# Patient Record
Sex: Female | Born: 1945 | Race: White | Hispanic: No | Smoking: Former smoker
Health system: Southern US, Community
[De-identification: ages and names within clinical notes are randomized; demographics above are authoritative.]

## PROBLEM LIST (undated history)

## (undated) DIAGNOSIS — E079 Disorder of thyroid, unspecified: Secondary | ICD-10-CM

## (undated) DIAGNOSIS — J189 Pneumonia, unspecified organism: Secondary | ICD-10-CM

## (undated) DIAGNOSIS — E785 Hyperlipidemia, unspecified: Secondary | ICD-10-CM

## (undated) DIAGNOSIS — I00 Rheumatic fever without heart involvement: Secondary | ICD-10-CM

## (undated) DIAGNOSIS — M1990 Unspecified osteoarthritis, unspecified site: Secondary | ICD-10-CM

## (undated) HISTORY — DX: Disorder of thyroid, unspecified: E07.9

## (undated) HISTORY — PX: TONSILLECTOMY: SUR1361

## (undated) HISTORY — DX: Pneumonia, unspecified organism: J18.9

## (undated) HISTORY — PX: APPENDECTOMY: SHX54

## (undated) HISTORY — DX: Hyperlipidemia, unspecified: E78.5

## (undated) HISTORY — DX: Unspecified osteoarthritis, unspecified site: M19.90

## (undated) HISTORY — DX: Rheumatic fever without heart involvement: I00

---

## 2014-04-06 ENCOUNTER — Encounter: Payer: Self-pay | Admitting: Obstetrics & Gynecology

## 2014-04-06 ENCOUNTER — Ambulatory Visit (INDEPENDENT_AMBULATORY_CARE_PROVIDER_SITE_OTHER): Payer: Medicare Other | Admitting: Obstetrics & Gynecology

## 2014-04-06 VITALS — BP 139/89 | HR 80 | Resp 16 | Ht 65.0 in | Wt 143.0 lb

## 2014-04-06 DIAGNOSIS — Z0000 Encounter for general adult medical examination without abnormal findings: Secondary | ICD-10-CM

## 2014-04-06 DIAGNOSIS — IMO0002 Reserved for concepts with insufficient information to code with codable children: Secondary | ICD-10-CM

## 2014-04-06 DIAGNOSIS — N8111 Cystocele, midline: Secondary | ICD-10-CM

## 2014-04-06 NOTE — Progress Notes (Signed)
   Subjective:    Patient ID: Michelle Pollard, female    DOB: March 24, 1946, 68 y.o.   MRN: [id number]  HPI  68 yo [id number] (55 and 45 yo daughters) here today because she thinks that something has prolapased from her vagina. She doesn't have any big symptoms but with standing she does feel some vaginal pressure. She is not sexually active. Her children were vaginal delivery (largest was 9-14). She denies GSUI, UI.  In the distant past she did have some GSUI and was offered a pessary but her GSUI resolved with time.  Review of Systems Last pap 5/15 Last mammogram 5/15 Last colonoscopy- due this year    Objective:   Physical Exam  4th degree cystocele with Valsalva No uterine prolapse or rectocele Bimanual exam reveals no masses or tenderness Moderate vulvar atrophy  I placed a #3 ring with good results. She was able to remove and replace it.      Assessment & Plan:  Cystocele- We discussed watchful waiting, pessary, and surgery. She has opted for pessary at this point. RTC 1 month for pessary check. Preventative care- check Vitamin D level

## 2014-04-07 ENCOUNTER — Telehealth: Payer: Self-pay | Admitting: *Deleted

## 2014-04-07 LAB — VITAMIN D 25 HYDROXY (VIT D DEFICIENCY, FRACTURES): Vit D, 25-Hydroxy: 50 ng/mL (ref 30–89)

## 2014-04-07 NOTE — Telephone Encounter (Signed)
Pt notified of normal Vitamin D levels.

## 2014-05-06 ENCOUNTER — Ambulatory Visit (INDEPENDENT_AMBULATORY_CARE_PROVIDER_SITE_OTHER): Payer: Medicare Other | Admitting: Obstetrics & Gynecology

## 2014-05-06 ENCOUNTER — Encounter: Payer: Self-pay | Admitting: Obstetrics & Gynecology

## 2014-05-06 VITALS — BP 135/86 | HR 74 | Resp 16 | Ht 64.0 in | Wt 147.0 lb

## 2014-05-06 DIAGNOSIS — IMO0002 Reserved for concepts with insufficient information to code with codable children: Secondary | ICD-10-CM

## 2014-05-06 DIAGNOSIS — N8111 Cystocele, midline: Secondary | ICD-10-CM

## 2014-05-06 NOTE — Progress Notes (Signed)
   Subjective:    Patient ID: Michelle Pollard, female    DOB: 1946-01-31, 68 y.o.   MRN: [id number]  HPI  She has been using her pessary whenever she feels like it is needed for the last month. She has no problems or questions.  Review of Systems     Objective:   Physical Exam  I removed and cleaned her pessary. I inspected the vulva and vagina and found it all to be normal.      Assessment & Plan:  Prolapse- continue pessary prn RTC 1 year/prn sooner

## 2014-08-16 ENCOUNTER — Encounter: Payer: Self-pay | Admitting: Obstetrics & Gynecology

## 2015-05-02 ENCOUNTER — Ambulatory Visit (INDEPENDENT_AMBULATORY_CARE_PROVIDER_SITE_OTHER): Payer: Medicare Other | Admitting: Obstetrics & Gynecology

## 2015-05-02 ENCOUNTER — Encounter: Payer: Self-pay | Admitting: Obstetrics & Gynecology

## 2015-05-02 VITALS — BP 123/77 | HR 79 | Resp 16 | Ht 64.0 in | Wt 156.0 lb

## 2015-05-02 DIAGNOSIS — N8110 Cystocele, unspecified: Secondary | ICD-10-CM

## 2015-05-02 DIAGNOSIS — IMO0002 Reserved for concepts with insufficient information to code with codable children: Secondary | ICD-10-CM

## 2015-05-02 NOTE — Progress Notes (Signed)
   Subjective:    Patient ID: Michelle Pollard, female    DOB: [birth year], 69 y.o.   MRN: [id number]  HPI  This 69 yo SW P2 is here for an annual pessary check. She has no complaints. She removes it nightly. She had a mammogram 5/16 and reports that it was normal. She is abstinent.  Review of Systems She will have a colonoscopy in three weeks.    Objective:   Physical Exam  [id number] Breathing and ambulating and conversing normally Abd- bening Vaginal mucosa normal Bimanual normal 4th degree cystocele but no other prolapse     Assessment & Plan:  Cystocele- continue #3 ring pessary RTC 1 year/prn sooner

## 2017-08-21 ENCOUNTER — Other Ambulatory Visit (INDEPENDENT_AMBULATORY_CARE_PROVIDER_SITE_OTHER): Payer: Medicare Other

## 2017-08-21 DIAGNOSIS — R350 Frequency of micturition: Secondary | ICD-10-CM | POA: Diagnosis not present

## 2017-08-21 LAB — POCT URINALYSIS DIPSTICK
Bilirubin, UA: NEGATIVE
Blood, UA: NEGATIVE
Glucose, UA: NEGATIVE
Ketones, UA: NEGATIVE
Nitrite, UA: NEGATIVE
Spec Grav, UA: 1.025 (ref 1.010–1.025)
UROBILINOGEN UA: NEGATIVE U/dL — AB
pH, UA: 6 (ref 5.0–8.0)

## 2017-08-21 NOTE — Progress Notes (Signed)
Pt c/o frequent, painful urination. Pt states she has had a UTI before that was treated with Bactrim and she had an allergic reaction to it. Urine culture sent.

## 2017-08-22 LAB — URINE CULTURE
MICRO NUMBER:: 81252400
SPECIMEN QUALITY:: ADEQUATE

## 2017-09-04 ENCOUNTER — Ambulatory Visit (INDEPENDENT_AMBULATORY_CARE_PROVIDER_SITE_OTHER): Payer: Medicare Other | Admitting: Obstetrics & Gynecology

## 2017-09-04 ENCOUNTER — Encounter: Payer: Self-pay | Admitting: Obstetrics & Gynecology

## 2017-09-04 VITALS — BP 154/83 | HR 74 | Ht 65.0 in | Wt 163.0 lb

## 2017-09-04 DIAGNOSIS — R350 Frequency of micturition: Secondary | ICD-10-CM

## 2017-09-04 DIAGNOSIS — D219 Benign neoplasm of connective and other soft tissue, unspecified: Secondary | ICD-10-CM | POA: Diagnosis not present

## 2017-09-04 NOTE — Progress Notes (Signed)
Patient ID: Michelle Pollard, female   DOB: Aug 18, 1946, 71 y.o.   MRN: [id number]  No chief complaint on file.   HPI Michelle Pollard is a 71 y.o. female.  She is here for a pessary check as she is feeling a fair amount of discomfort with BMs with the pessary in. She had a X ray done which showed a high stool burden as well as a possible fibroid. She is concerned about the fibroid. She is also having urinary frequency but repeat urine tests rule out a UTI. HPI  Past Medical History:  Diagnosis Date  . Arthritis   . Hyperlipidemia   . Pneumonia   . Rheumatic fever   . Thyroid disease     Past Surgical History:  Procedure Laterality Date  . APPENDECTOMY    . TONSILLECTOMY      Family History  Problem Relation Age of Onset  . Hypertension Mother   . Angina Mother   . Cancer Mother        breast  . Heart attack Father   . Hyperlipidemia Brother     Social History Social History   Tobacco Use  . Smoking status: Former Smoker    Packs/day: 0.50    Years: 5.00    Pack years: 2.50    Types: Cigarettes  . Smokeless tobacco: Never Used  Substance Use Topics  . Alcohol use: Yes    Comment: wine occassionally  . Drug use: No    Allergies  Allergen Reactions  . Bactrim Ds [Sulfamethoxazole-Trimethoprim]   . Tetracyclines & Related Rash    Current Outpatient Medications  Medication Sig Dispense Refill  . ALPRAZolam (XANAX) 0.5 MG tablet     . Calcium Carbonate-Vit D-Min (CALCIUM 1200) 1200-1000 MG-UNIT CHEW Chew by mouth.    . lansoprazole (PREVACID) 15 MG capsule Take by mouth.    . levothyroxine (SYNTHROID, LEVOTHROID) 88 MCG tablet     . atorvastatin (LIPITOR) 10 MG tablet     . cholecalciferol (VITAMIN D) 1000 UNITS tablet Take 1,000 Units by mouth daily.    . meloxicam (MOBIC) 15 MG tablet Take 15 mg by mouth as needed for pain.     No current facility-administered medications for this visit.     Review of Systems Review of Systems  Blood pressure (!)  154/83, pulse 74, height 5\' 5"  (1.651 m), weight 163 lb (73.9 kg).  Physical Exam Physical Exam Breathing, conversing, and ambulating normally Well nourished, well hydrated White female, no apparent distress 4th degree cystocele, no rectocele Healthy vaginal mucosa, free of excoriations  I fitted her with a #2 ring with support (after removing her #3 ring). The #2 relieved her symptoms and did not cause pain.  Data Reviewed X ray from [facility]  Assessment   urinary frequency cystocele     Plan   urology referral She is not interested in surgical repair at this time I will have a #2 ring with support ordered and she can pick it up prn I ordered an u/s to evaluate the possible fibroid, but I reassured her that this is almost certainly a benign condition        Damek Ende C Maxx Pham 09/04/2017, 10:54 AM

## 2017-09-04 NOTE — Progress Notes (Signed)
Pt states that pessary can be uncomfortable when she has a bowel movement. Pt also states that she has frequent urination and burning quite often & when she is tested for a UTI, it comes back negative. Pt had abdominal xray done on 08/23/17 though [facility] and they told pt she may have a uterine fibroid (results are in care everywhere).   It wouldn't let me print the Xray Results so I copy and pasted what it said:  "Mild to moderate fecal retention throughout the colon. No bowel obstruction or free air. Coarse ossification cluster in the left pelvis, likely representing a uterine fibroid. No acute bone findings. Upper quadrant surgical clips."

## 2017-10-17 ENCOUNTER — Telehealth: Payer: Self-pay

## 2017-10-17 NOTE — Telephone Encounter (Signed)
Left message to make pt aware that her pessary is in and to call to make appointment.

## 2017-10-22 ENCOUNTER — Ambulatory Visit (INDEPENDENT_AMBULATORY_CARE_PROVIDER_SITE_OTHER): Payer: Medicare Other | Admitting: Obstetrics & Gynecology

## 2017-10-22 ENCOUNTER — Encounter: Payer: Self-pay | Admitting: Obstetrics & Gynecology

## 2017-10-22 VITALS — BP 135/84 | Resp 16 | Ht 65.0 in

## 2017-10-22 DIAGNOSIS — N8111 Cystocele, midline: Secondary | ICD-10-CM | POA: Diagnosis not present

## 2017-10-22 NOTE — Progress Notes (Signed)
   Subjective:    Patient ID: Michelle Pollard, female    DOB: [birth year], 72 y.o.   MRN: [id number]  HPI 72 year old single P2 here for her smaller pessary. She was having discomfort with her #3 ring when having a BM. The #2 pessary has now arrived.   Review of Systems     Objective:   Physical Exam Breathing, conversing, and ambulating normally Well nourished, well hydrated White female, no apparent distress The #2 ring with support was placed. She had no discomfort and it relieved her vaginal pressure.     Assessment & Plan:  Cystocele- #2 ring with support She will continue to remove and clean it at least weekly. Come back 1 year/prn sooner

## 2017-10-29 ENCOUNTER — Other Ambulatory Visit: Payer: Self-pay | Admitting: Obstetrics & Gynecology

## 2017-10-29 ENCOUNTER — Ambulatory Visit (INDEPENDENT_AMBULATORY_CARE_PROVIDER_SITE_OTHER): Payer: Medicare Other

## 2017-10-29 DIAGNOSIS — D259 Leiomyoma of uterus, unspecified: Secondary | ICD-10-CM

## 2017-10-29 DIAGNOSIS — D219 Benign neoplasm of connective and other soft tissue, unspecified: Secondary | ICD-10-CM

## 2017-11-06 ENCOUNTER — Telehealth: Payer: Self-pay | Admitting: *Deleted

## 2017-11-06 NOTE — Telephone Encounter (Signed)
Pt called requesting that her latest U/S report be faxed to Dr Gloriann Loan @ [facility].  This was faxed to [phone]

## 2018-01-01 ENCOUNTER — Encounter: Payer: Self-pay | Admitting: *Deleted

## 2018-10-29 ENCOUNTER — Ambulatory Visit: Payer: Medicare Other | Admitting: Physical Therapy

## 2018-10-29 ENCOUNTER — Encounter: Payer: Self-pay | Admitting: Physical Therapy

## 2018-10-29 ENCOUNTER — Other Ambulatory Visit: Payer: Self-pay

## 2018-10-29 DIAGNOSIS — R29898 Other symptoms and signs involving the musculoskeletal system: Secondary | ICD-10-CM | POA: Diagnosis not present

## 2018-10-29 DIAGNOSIS — M25571 Pain in right ankle and joints of right foot: Secondary | ICD-10-CM

## 2018-10-29 NOTE — Patient Instructions (Signed)
Access Code: [email or web address]  URL: [email or web address]  Date: 10/29/2018  Prepared by: Faustino Congress   Exercises  Gastroc Stretch on Wall - 3 reps - 1 sets - 30 sec hold - 2x daily - 7x weekly  Soleus Stretch on Wall - 3 reps - 1 sets - 30 sec hold - 2x daily - 7x weekly  Calf Mobilization with Small Ball - 3 reps - 1 sets - 30-60 sec hold - 1x daily - 7x weekly  Patient Education  Trigger Point Dry Needling

## 2018-10-30 NOTE — Therapy (Signed)
[place], [place], [address] Phone: [phone]   Fax:  [phone]  Physical Therapy Evaluation  Patient Details  Name: Michelle Pollard MRN: [id number] Date of Birth: January 23, 1946 Referring Provider (PT): Dr. Melrose Nakayama   Encounter Date: 10/29/2018  PT End of Session - 10/29/18 1514    Visit Number  1    Number of Visits  12    Date for PT Re-Evaluation  12/10/18    PT Start Time  5284    PT Stop Time  1513    PT Time Calculation (min)  42 min    Activity Tolerance  Patient tolerated treatment well    Behavior During Therapy  [facility] for tasks assessed/performed       Past Medical History:  Diagnosis Date  . Arthritis   . Hyperlipidemia   . Pneumonia   . Rheumatic fever   . Thyroid disease     Past Surgical History:  Procedure Laterality Date  . APPENDECTOMY    . TONSILLECTOMY      There were no vitals filed for this visit.   Subjective Assessment - 10/29/18 1434    Subjective  Pt is a 73 y/o female who presents to OPPT for Rt ankle pain.  Pt reports intermittent pain x 2 months with no known injury.  Pt also resports Rt knee pain which is mostly resolved.      Limitations  Walking    How long can you walk comfortably?  occasional pain; worse following    Patient Stated Goals  improve ankle pain    Currently in Pain?  Yes    Pain Score  1    up to 3/10; at best 0/10   Pain Location  Heel    Pain Orientation  Right    Pain Descriptors / Indicators  Tightness;Burning    Pain Type  Acute pain    Pain Onset  More than a month ago    Pain Frequency  Intermittent    Aggravating Factors   worse in AM (especially when prior day very active); hamstring stretches provided by MD office    Pain Relieving Factors  improved after walking in AM 1-2 min         [facility] PT Assessment - 10/29/18 1438      Assessment   Medical Diagnosis  Rt ankle sprain/pain    Referring Provider (PT)   Dr. Melrose Nakayama    Onset Date/Surgical Date  --   2 months   Next MD Visit  11/26/18    Prior Therapy  n/a      Precautions   Precautions  None      Restrictions   Weight Bearing Restrictions  No      Balance Screen   Has the patient fallen in the past 6 months  No    Has the patient had a decrease in activity level because of a fear of falling?   No    Is the patient reluctant to leave their home because of a fear of falling?   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    Leisure  movies, previously agility sports with dogs; regular walking several times/wk      Cognition   Overall Cognitive Status  Within Functional Limits for tasks assessed      Observation/Other Assessments   Focus on Therapeutic Outcomes (FOTO)   66 (34% limited; predicted 24% limited)      ROM / Strength   AROM / PROM / Strength  AROM;Strength      AROM   AROM Assessment Site  Ankle    Right/Left Ankle  Right;Left    Right Ankle Dorsiflexion  3    Right Ankle Plantar Flexion  58    Right Ankle Inversion  30    Right Ankle Eversion  16    Left Ankle Dorsiflexion  -3    Left Ankle Plantar Flexion  63    Left Ankle Inversion  34    Left Ankle Eversion  12      Strength   Strength Assessment Site  Hip;Knee;Ankle    Right/Left Hip  Right;Left    Right Hip Flexion  5/5    Left Hip Flexion  5/5    Right/Left Knee  Right;Left    Right Knee Extension  5/5    Left Knee Extension  5/5    Right/Left Ankle  Right;Left    Right Ankle Dorsiflexion  5/5    Right  Ankle Plantar Flexion  5/5    Right Ankle Inversion  5/5    Right Ankle Eversion  5/5    Left Ankle Dorsiflexion  5/5    Left Ankle Plantar Flexion  5/5    Left Ankle Inversion  5/5    Left Ankle Eversion  5/5      Flexibility   Soft Tissue Assessment /Muscle Length  yes    Hamstrings  mild tightness on Rt      Palpation   Palpation comment  tenderness Rt peroneal tendon and trigger points in gastroc and soleus      Ambulation/Gait   Gait Comments  no significant gait abnormalities noted                Objective measurements completed on examination: See above findings.      [facility] Adult PT Treatment/Exercise - 10/29/18 1438      Self-Care   Self-Care  Other Self-Care Comments    Other Self-Care Comments   self STM with ball      Exercises   Exercises  Ankle      Ankle Exercises: Stretches   Soleus Stretch  1 rep;30 seconds    Gastroc Stretch  1 rep;30 seconds             PT Education - 10/29/18 1513    Education Details  HEP    Person(s) Educated  Patient    Methods  Explanation;Demonstration;Handout    Comprehension  Verbalized understanding;Returned demonstration;Need further instruction          PT Long Term Goals - 10/30/18 0803      PT LONG TERM GOAL #1   Title  independent with HEP    Status  New    Target Date  12/11/18      PT LONG TERM GOAL #2   Title  demonstrate Rt  ankle ROM within 1-2 degrees of Lt ankle for improved function    Status  New    Target Date  12/11/18      PT LONG TERM GOAL #3   Title  report pain < 2/10 in mornings for improved pain and function    Status  New    Target Date  12/11/18      PT LONG TERM GOAL #4   Title  FOTO score improved to </= 25% limited for improved function    Status  New    Target Date  12/11/18             Plan - 10/30/18 0800    Clinical Impression Statement  Pt is a 73 y/o female who presents to OPPT for Rt ankle pain x 2 months.  Pt demonstrates mild ROM limitations,  and active trigger points affecting functional mobility.  Clinical findings consistent with achilles tendinosis and/or plantar fasciitis.  Pt will benefit from PT to address deficits listed.      Clinical Presentation  Stable    Clinical Decision Making  Low    Rehab Potential  Good    PT Frequency  2x / week    PT Duration  6 weeks    PT Treatment/Interventions  ADLs/Self Care Home Management;Cryotherapy;Ultrasound;Moist Heat;Iontophoresis 4mg /ml Dexamethasone;Electrical Stimulation;Gait training;Stair training;Functional mobility training;Balance training;Therapeutic exercise;Therapeutic activities;Patient/family education;Manual techniques;Dry needling;Passive range of motion;Taping;Vasopneumatic Device    PT Next Visit Plan  review HEP, PRN, manual/modalities/DN, gentle strengthening exercises    PT Home Exercise Plan  Access Code: [email or web address]        Patient will benefit from skilled therapeutic intervention in order to improve the following deficits and impairments:  Decreased range of motion, Increased muscle spasms, Increased fascial restricitons, Pain  Visit Diagnosis: Pain in right ankle and joints of right foot - Plan: PT plan of care cert/re-cert  Other symptoms and signs involving the musculoskeletal system - Plan: PT plan of care cert/re-cert     Problem List There are no active problems to display for this patient.     Laureen Abrahams, PT, DPT 10/30/18 8:09 AM     [facility] [address] [place] [address], [place], [address] Phone: [phone]   Fax:  [phone]  Name: Shani Fitch MRN: [id number] Date of Birth: [birth year]

## 2018-11-04 ENCOUNTER — Ambulatory Visit: Payer: Medicare Other | Admitting: Physical Therapy

## 2018-11-04 ENCOUNTER — Encounter: Payer: Self-pay | Admitting: Physical Therapy

## 2018-11-04 DIAGNOSIS — R29898 Other symptoms and signs involving the musculoskeletal system: Secondary | ICD-10-CM

## 2018-11-04 DIAGNOSIS — M25571 Pain in right ankle and joints of right foot: Secondary | ICD-10-CM | POA: Diagnosis not present

## 2018-11-04 NOTE — Therapy (Signed)
[place], [place], [address] Phone: [phone]   Fax:  [phone]  Physical Therapy Treatment  Patient Details  Name: Michelle Pollard MRN: [id number] Date of Birth: 02-04-1946 Referring Provider (PT): Dr. Melrose Nakayama   Encounter Date: 11/04/2018  PT End of Session - 11/04/18 1252    Visit Number  2    Number of Visits  12    Date for PT Re-Evaluation  12/10/18    PT Start Time  1200    PT Stop Time  1240    PT Time Calculation (min)  40 min    Activity Tolerance  Patient tolerated treatment well    Behavior During Therapy  [facility] for tasks assessed/performed       Past Medical History:  Diagnosis Date  . Arthritis   . Hyperlipidemia   . Pneumonia   . Rheumatic fever   . Thyroid disease     Past Surgical History:  Procedure Laterality Date  . APPENDECTOMY    . TONSILLECTOMY      There were no vitals filed for this visit.  Subjective Assessment - 11/04/18 1200    Subjective  ankle has felt a little better; walking every day for at least 30 min.  doing HEP provided here and stopped other exercises.     Patient Stated Goals  improve ankle pain    Currently in Pain?  Yes    Pain Score  0-No pain   up to a 1/10 this morning   Pain Location  Heel    Pain Orientation  Right;Posterior    Pain Descriptors / Indicators  Tightness    Pain Type  Acute pain    Pain Onset  More than a month ago    Pain Frequency  Intermittent    Aggravating Factors   worse in AM    Pain Relieving Factors  improved after walking, stretches are helpful         [facility] PT Assessment - 11/04/18 1205      Assessment   Medical Diagnosis  Rt ankle sprain/pain    Referring Provider (PT)  Dr. Melrose Nakayama                   [facility] Adult PT Treatment/Exercise - 11/04/18 1205      Self-Care   Other Self-Care Comments   reviewed use of tennis ball and rolling instrument for STM and to decrease  fascial adhesions      Manual Therapy   Manual Therapy  Soft tissue mobilization    Manual therapy comments  pt prone    Soft tissue mobilization  IASTM to Rt gastroc/soleus and plantar fascia      Ankle Exercises: Aerobic   Recumbent Bike  L2 x 5 min      Ankle Exercises: Stretches   Soleus Stretch  2 reps;30 seconds    Gastroc Stretch  2 reps;30 seconds      Ankle Exercises: Standing   Other Standing Ankle Exercises  4-way theraband x 20 reps; Rt; green                  PT Long Term Goals - 10/30/18 0803      PT LONG TERM GOAL #1   Title  independent with HEP    Status  New    Target Date  12/11/18      PT LONG TERM GOAL #2   Title  demonstrate Rt ankle ROM within  1-2 degrees of Lt ankle for improved function    Status  New    Target Date  12/11/18      PT LONG TERM GOAL #3   Title  report pain < 2/10 in mornings for improved pain and function    Status  New    Target Date  12/11/18      PT LONG TERM GOAL #4   Title  FOTO score improved to </= 25% limited for improved function    Status  New    Target Date  12/11/18            Plan - 11/04/18 1252    Clinical Impression Statement  Pt tolerated session well today reporting decreased tightness following manual therapy.  Overall HEP has been beneficial and burning pain seems to be resolved at this time.  Initiated gentle strengthening but slight increase in symptoms following, so plan to provide for HEP if symptoms don't flare up.  Will continue to benefit from PT to maximize function.    Rehab Potential  Good    PT Frequency  2x / week    PT Duration  6 weeks    PT Treatment/Interventions  ADLs/Self Care Home Management;Cryotherapy;Ultrasound;Moist Heat;Iontophoresis 4mg /ml Dexamethasone;Electrical Stimulation;Gait training;Stair training;Functional mobility training;Balance training;Therapeutic exercise;Therapeutic activities;Patient/family education;Manual techniques;Dry needling;Passive range of  motion;Taping;Vasopneumatic Device    PT Next Visit Plan  continue per POC, pt not sure about DN at this time, manual/modalities/DN, gentle strengthening exercises and add to HEP as she tolerates    PT Home Exercise Plan  Access Code: [email or web address]     Consulted and Agree with Plan of Care  Patient       Patient will benefit from skilled therapeutic intervention in order to improve the following deficits and impairments:  Decreased range of motion, Increased muscle spasms, Increased fascial restricitons, Pain  Visit Diagnosis: Pain in right ankle and joints of right foot  Other symptoms and signs involving the musculoskeletal system     Problem List There are no active problems to display for this patient.     Laureen Abrahams, PT, DPT 11/04/18 12:55 PM    [facility] [place], [place], [address] Phone: [phone]   Fax:  [phone]  Name: Michelle Pollard MRN: [id number] Date of Birth: [birth year]

## 2018-11-06 ENCOUNTER — Encounter: Payer: Self-pay | Admitting: Physical Therapy

## 2018-11-06 ENCOUNTER — Ambulatory Visit: Payer: Medicare Other | Admitting: Physical Therapy

## 2018-11-06 DIAGNOSIS — M25571 Pain in right ankle and joints of right foot: Secondary | ICD-10-CM | POA: Diagnosis not present

## 2018-11-06 DIAGNOSIS — R29898 Other symptoms and signs involving the musculoskeletal system: Secondary | ICD-10-CM | POA: Diagnosis not present

## 2018-11-06 NOTE — Therapy (Signed)
[place], [place], [address] Phone: [phone]   Fax:  [phone]  Physical Therapy Treatment  Patient Details  Name: Michelle Pollard MRN: [id number] Date of Birth: 03-01-1946 Referring Provider (PT): Dr. Melrose Nakayama   Encounter Date: 11/06/2018  PT End of Session - 11/06/18 1105    Visit Number  3    Number of Visits  12    Date for PT Re-Evaluation  12/10/18    PT Start Time  1105    PT Stop Time  1155   MHP last 10 min    PT Time Calculation (min)  50 min    Activity Tolerance  Patient tolerated treatment well    Behavior During Therapy  [facility] for tasks assessed/performed       Past Medical History:  Diagnosis Date  . Arthritis   . Hyperlipidemia   . Pneumonia   . Rheumatic fever   . Thyroid disease     Past Surgical History:  Procedure Laterality Date  . APPENDECTOMY    . TONSILLECTOMY      There were no vitals filed for this visit.  Subjective Assessment - 11/06/18 1109    Subjective  Pt reports she continues to have twinges of pain that last up to 5 min at a time.   She went walking for 30 min after last visit; Achilles did well, but the lateral part of ankle had some increased pain (1/10) that lasted 30 additional minutes.     How long can you walk comfortably?  occasional pain; worse following    Patient Stated Goals  improve ankle pain    Currently in Pain?  No/denies    Pain Score  0-No pain         [facility] PT Assessment - 11/06/18 0001      Assessment   Medical Diagnosis  Rt ankle sprain/pain    Referring Provider (PT)  Dr. Melrose Nakayama    Onset Date/Surgical Date  --   2 months   Next MD Visit  11/26/18    Prior Therapy  n/a      AROM   Right/Left Ankle  Right    Right Ankle Dorsiflexion  8       [facility] Adult PT Treatment/Exercise - 11/06/18 0001      Modalities   Modalities  Moist Heat      Moist Heat Therapy   Number Minutes Moist Heat  10  Minutes    Moist Heat Location  Ankle   Rt     Manual Therapy   Soft tissue mobilization  STM to Rt gastroc/soleus, fibularis muscles.  IASTM to fibularis group not tolerated.      Ankle Exercises: Aerobic   Recumbent Bike  L2 x 4 min for warm up.       Ankle Exercises: Stretches   Soleus Stretch  2 reps;30 seconds    Gastroc Stretch  2 reps;30 seconds   Lt/Rt      Ankle Exercises: Standing   SLS  SLS Rt/Lt 30 sec x 2 reps each side with occasional UE support to steady    Warrior II  Rt/Lt foot leading x 20 sec x 2 reps each side.     Other Standing Ankle Exercises  Lt step down and Rt retro step up for ROM x 10 reps (UE support on rails)      Ankle Exercises: Supine   T-Band  red band x 15 reps each (inversion,  eversion, DF)                  PT Long Term Goals - 10/30/18 0803      PT LONG TERM GOAL #1   Title  independent with HEP    Status  New    Target Date  12/11/18      PT LONG TERM GOAL #2   Title  demonstrate Rt ankle ROM within 1-2 degrees of Lt ankle for improved function    Status  New    Target Date  12/11/18      PT LONG TERM GOAL #3   Title  report pain < 2/10 in mornings for improved pain and function    Status  New    Target Date  12/11/18      PT LONG TERM GOAL #4   Title  FOTO score improved to </= 25% limited for improved function    Status  New    Target Date  12/11/18            Plan - 11/06/18 1145    Clinical Impression Statement  Improved Rt ankle DF ROM.  She tolerated all exercises well; reduced band exercise to red with improved ability.  Palpable tightness/point tenderness in Rt fibularis brevis.  Tightness in fibularis group and calf reduced with STM and heat.  Pt progressing towards goals.     Rehab Potential  Good    PT Frequency  2x / week    PT Duration  6 weeks    PT Treatment/Interventions  ADLs/Self Care Home Management;Cryotherapy;Ultrasound;Moist Heat;Iontophoresis 4mg /ml Dexamethasone;Electrical  Stimulation;Gait training;Stair training;Functional mobility training;Balance training;Therapeutic exercise;Therapeutic activities;Patient/family education;Manual techniques;Dry needling;Passive range of motion;Taping;Vasopneumatic Device    PT Next Visit Plan  continue per POC, trial of ionto, continue manual/modalities/DN, gentle strengthening exercises and add to HEP as she tolerates    PT Home Exercise Plan  Access Code: [email or web address] - verbally added SLS and ankle circles/alphabet.     Consulted and Agree with Plan of Care  Patient       Patient will benefit from skilled therapeutic intervention in order to improve the following deficits and impairments:  Decreased range of motion, Increased muscle spasms, Increased fascial restricitons, Pain  Visit Diagnosis: Pain in right ankle and joints of right foot  Other symptoms and signs involving the musculoskeletal system     Problem List There are no active problems to display for this patient.  Kerin Perna, PTA 11/06/18 1:23 PM  [facility] [place], [place], [address] Phone: [phone]   Fax:  [phone]  Name: Michelle Pollard MRN: [id number] Date of Birth: [birth year]

## 2018-11-12 ENCOUNTER — Ambulatory Visit: Payer: Medicare Other | Admitting: Physical Therapy

## 2018-11-12 DIAGNOSIS — M25571 Pain in right ankle and joints of right foot: Secondary | ICD-10-CM | POA: Diagnosis not present

## 2018-11-12 DIAGNOSIS — R29898 Other symptoms and signs involving the musculoskeletal system: Secondary | ICD-10-CM | POA: Diagnosis not present

## 2018-11-12 NOTE — Patient Instructions (Signed)

## 2018-11-12 NOTE — Therapy (Signed)
[place], [place], [address] Phone: [phone]   Fax:  [phone]  Physical Therapy Treatment  Patient Details  Name: Michelle Pollard MRN: [id number] Date of Birth: 09/09/1946 Referring Provider (PT): Dr. Melrose Nakayama   Encounter Date: 11/12/2018  PT End of Session - 11/12/18 1018    Visit Number  4    Number of Visits  12    Date for PT Re-Evaluation  12/10/18    PT Start Time  0936    PT Stop Time  1016    PT Time Calculation (min)  40 min       Past Medical History:  Diagnosis Date  . Arthritis   . Hyperlipidemia   . Pneumonia   . Rheumatic fever   . Thyroid disease     Past Surgical History:  Procedure Laterality Date  . APPENDECTOMY    . TONSILLECTOMY      There were no vitals filed for this visit.  Subjective Assessment - 11/12/18 0943    Subjective  pt reports her Achilles has been more sore since performing the soleus stretch. "My calf feels much better now." She has not been able to walk as far either.     How long can you walk comfortably?  occasional pain; worse following    Patient Stated Goals  improve ankle pain    Currently in Pain?  Yes    Pain Score  1    up to 4/10    Pain Location  Ankle    Pain Orientation  Right;Posterior    Aggravating Factors   worse in morning; soleus stretch    Pain Relieving Factors  improved after walking a little.          [facility] PT Assessment - 11/12/18 0001      Assessment   Medical Diagnosis  Rt ankle sprain/pain    Referring Provider (PT)  Dr. Melrose Nakayama    Onset Date/Surgical Date  --   2 months   Next MD Visit  11/26/18    Prior Therapy  n/a      AROM   Right/Left Ankle  Right;Left    Right Ankle Dorsiflexion  11    Right Ankle Plantar Flexion  58    Right Ankle Inversion  41    Right Ankle Eversion  18    Left Ankle Dorsiflexion  7    Left Ankle Plantar Flexion  58    Left Ankle Inversion  38    Left  Ankle Eversion  12      Strength   Right Ankle Dorsiflexion  5/5    Right Ankle Inversion  5/5    Right Ankle Eversion  5/5      Palpation   Palpation comment  tenderness with palpation to medial Rt Achilles tendon, as well as distal post tib tendon (along tibia and into foot)       [facility] Adult PT Treatment/Exercise - 11/12/18 0001      Self-Care   Self-Care  Heat/Ice Application    Heat/Ice Application  Pt instructed in use of ice application to bottom of foot with ice bottle; pt returned demo with cues.       Modalities   Modalities  Iontophoresis;Ultrasound      Ultrasound   Ultrasound Location  Rt medial ankle at Achilles / post tib tendon    Ultrasound Parameters  50%, 1.1 w/cm2, 8 min     Ultrasound Goals  Pain      Iontophoresis   Type of Iontophoresis  Dexamethasone    Location  Lt medial ankle at Achilles tendon/ post tib tendon    Dose  1.0 cc    Time  4 hr patch, 80 mA stat patch      Manual Therapy   Soft tissue mobilization  STM to Rt gastroc/soleus muscles.        Ankle Exercises: Aerobic   Nustep  L4: 6 min for warm up      Ankle Exercises: Stretches   Soleus Stretch  2 reps;30 seconds   seated, with strap, each leg   Gastroc Stretch  2 reps;30 seconds   incline board     Ankle Exercises: Supine   Other Supine Ankle Exercises  Rt ankle circles                   PT Long Term Goals - 10/30/18 0803      PT LONG TERM GOAL #1   Title  independent with HEP    Status  New    Target Date  12/11/18      PT LONG TERM GOAL #2   Title  demonstrate Rt ankle ROM within 1-2 degrees of Lt ankle for improved function    Status  New    Target Date  12/11/18      PT LONG TERM GOAL #3   Title  report pain < 2/10 in mornings for improved pain and function    Status  New    Target Date  12/11/18      PT LONG TERM GOAL #4   Title  FOTO score improved to </= 25% limited for improved function    Status  New    Target Date  12/11/18             Plan - 11/12/18 1310    Clinical Impression Statement  Pt has reported increase pain in Rt Achilles with standing soleus stretch; improved tolerance with seated stretch.  Trial of [place] and ionto to area.  No new goals met this date.      Rehab Potential  Good    PT Frequency  2x / week    PT Duration  6 weeks    PT Treatment/Interventions  ADLs/Self Care Home Management;Cryotherapy;Ultrasound;Moist Heat;Iontophoresis 67m/ml Dexamethasone;Electrical Stimulation;Gait training;Stair training;Functional mobility training;Balance training;Therapeutic exercise;Therapeutic activities;Patient/family education;Manual techniques;Dry needling;Passive range of motion;Taping;Vasopneumatic Device    PT Next Visit Plan  continue per POC, pt not sure about DN at this time, manual/modalities/DN, gentle strengthening exercises and add to HEP as she tolerates    PT Home Exercise Plan  Access Code: [email or web address]     Consulted and Agree with Plan of Care  Patient       Patient will benefit from skilled therapeutic intervention in order to improve the following deficits and impairments:  Decreased range of motion, Increased muscle spasms, Increased fascial restricitons, Pain  Visit Diagnosis: Pain in right ankle and joints of right foot  Other symptoms and signs involving the musculoskeletal system     Problem List There are no active problems to display for this patient.  JKerin Perna PTA 11/12/18 1:16 PM  [facility] [address]Phone: [phone]  Fax:  [phone] Name: Michelle MccaugheyMRN: [id number] of Birth: 112/11/1945

## 2018-11-14 ENCOUNTER — Ambulatory Visit: Payer: Medicare Other | Admitting: Physical Therapy

## 2018-11-14 DIAGNOSIS — M25571 Pain in right ankle and joints of right foot: Secondary | ICD-10-CM | POA: Diagnosis not present

## 2018-11-14 DIAGNOSIS — R29898 Other symptoms and signs involving the musculoskeletal system: Secondary | ICD-10-CM | POA: Diagnosis not present

## 2018-11-14 NOTE — Therapy (Signed)
[place], [place], [address] Phone: [phone]   Fax:  [phone]  Physical Therapy Treatment  Patient Details  Name: Michelle Pollard MRN: [id number] Date of Birth: [birth year] Referring Provider (PT): Dr. Melrose Nakayama   Encounter Date: 11/14/2018  PT End of Session - 11/14/18 1022    Visit Number  5    Number of Visits  12    Date for PT Re-Evaluation  12/10/18    PT Start Time  1018    PT Stop Time  1105    PT Time Calculation (min)  47 min       Past Medical History:  Diagnosis Date  . Arthritis   . Hyperlipidemia   . Pneumonia   . Rheumatic fever   . Thyroid disease     Past Surgical History:  Procedure Laterality Date  . APPENDECTOMY    . TONSILLECTOMY      There were no vitals filed for this visit.  Subjective Assessment - 11/14/18 1022    Subjective  Pt reports the day after last session she woke up pain free; first time since November.  She's very excited about this.  She held off on walking for exercise yesterday.      Patient Stated Goals  improve ankle pain    Currently in Pain?  No/denies    Pain Score  0-No pain    Pain Location  Ankle         [facility] PT Assessment - 11/14/18 0001      Assessment   Medical Diagnosis  Rt ankle sprain/pain    Referring Provider (PT)  Dr. Melrose Nakayama    Onset Date/Surgical Date  --   2 months   Next MD Visit  11/26/18    Prior Therapy  n/a       [facility] Adult PT Treatment/Exercise - 11/14/18 0001      Ultrasound   Ultrasound Location  Rt medial ankle at Achilles / post tib tendon    Ultrasound Parameters  50%, 1.1 w/cm2, 8 min     Ultrasound Goals  Pain      Iontophoresis   Type of Iontophoresis  Dexamethasone    Location  Lt medial ankle at Achilles tendon/ post tib tendon    Dose  1.0 cc    Time  4 hr patch, 80 mA stat patch      Ankle Exercises: Aerobic   Nustep  L4: 6 min for warm up      Ankle Exercises:  Stretches   Soleus Stretch  2 reps;30 seconds   seated, with strap, each leg   Gastroc Stretch  2 reps;30 seconds   incline board     Ankle Exercises: Supine   T-Band  red band x 15 reps each (inversion, eversion, DF)      Ankle Exercises: Seated   BAPS  Sitting;Level 3;10 reps   PF/DF, inv/ever, CW/CCW     Ankle Exercises: Standing   SLS  SLS Lt x 30 sec, Rt  x 10 sec, 20 sec (no pain- improved performance)             PT Education - 11/14/18 1156    Education Details  HEP- updated    Person(s) Educated  Patient    Methods  Explanation;Handout;Demonstration;Verbal cues    Comprehension  Verbalized understanding          PT Long Term Goals - 10/30/18 7169      PT  LONG TERM GOAL #1   Title  independent with HEP    Status  New    Target Date  12/11/18      PT LONG TERM GOAL #2   Title  demonstrate Rt ankle ROM within 1-2 degrees of Lt ankle for improved function    Status  New    Target Date  12/11/18      PT LONG TERM GOAL #3   Title  report pain < 2/10 in mornings for improved pain and function    Status  New    Target Date  12/11/18      PT LONG TERM GOAL #4   Title  FOTO score improved to </= 25% limited for improved function    Status  New    Target Date  12/11/18            Plan - 11/14/18 1344    Clinical Impression Statement  Pt had positive response to last session, including ionto patch to Achilles.  Pt tolerated all exercises today without increase in pain, just fatigue.  Progressing towards goals.     Rehab Potential  Good    PT Frequency  2x / week    PT Duration  6 weeks    PT Treatment/Interventions  ADLs/Self Care Home Management;Cryotherapy;Ultrasound;Moist Heat;Iontophoresis 4mg /ml Dexamethasone;Electrical Stimulation;Gait training;Stair training;Functional mobility training;Balance training;Therapeutic exercise;Therapeutic activities;Patient/family education;Manual techniques;Dry needling;Passive range of  motion;Taping;Vasopneumatic Device    PT Next Visit Plan  assess response to HEP update.  Progress HEP as tolerated.      PT Home Exercise Plan  Access Code: [email or web address]     [email or web address] and Agree with Plan of Care  Patient       Patient will benefit from skilled therapeutic intervention in order to improve the following deficits and impairments:  Decreased range of motion, Increased muscle spasms, Increased fascial restricitons, Pain  Visit Diagnosis: Pain in right ankle and joints of right foot  Other symptoms and signs involving the musculoskeletal system     Problem List There are no active problems to display for this patient.  Kerin Perna, PTA 11/14/18 1:45 PM  [facility] [place], [place], [address] Phone: [phone]   Fax:  [phone]  Name: Michelle Pollard MRN: [id number] Date of Birth: 06-24-1946

## 2018-11-18 ENCOUNTER — Encounter: Payer: Self-pay | Admitting: Physical Therapy

## 2018-11-18 ENCOUNTER — Ambulatory Visit: Payer: Medicare Other | Admitting: Physical Therapy

## 2018-11-18 DIAGNOSIS — M25571 Pain in right ankle and joints of right foot: Secondary | ICD-10-CM | POA: Diagnosis not present

## 2018-11-18 DIAGNOSIS — R29898 Other symptoms and signs involving the musculoskeletal system: Secondary | ICD-10-CM | POA: Diagnosis not present

## 2018-11-18 NOTE — Therapy (Signed)
[place], [place], [address] Phone: [phone]   Fax:  [phone]  Physical Therapy Treatment  Patient Details  Name: Michelle Pollard MRN: [id number] Date of Birth: 12/01/1945 Referring Provider (PT): Dr. Melrose Nakayama   Encounter Date: 11/18/2018  PT End of Session - 11/18/18 0934    Visit Number  6    Number of Visits  12    Date for PT Re-Evaluation  12/10/18    PT Start Time  0932    PT Stop Time  0932    PT Time Calculation (min)  43 min    Activity Tolerance  Patient tolerated treatment well;No increased pain    Behavior During Therapy  WFL for tasks assessed/performed       Past Medical History:  Diagnosis Date  . Arthritis   . Hyperlipidemia   . Pneumonia   . Rheumatic fever   . Thyroid disease     Past Surgical History:  Procedure Laterality Date  . APPENDECTOMY    . TONSILLECTOMY      There were no vitals filed for this visit.  Subjective Assessment - 11/18/18 0937    Subjective  Pt reports she was doing great after last session.  However, she slipped on a wet tile at [employer] later that day and she had increased pain in her ankle (up to 5/10), that lasted for a few days.  Today she has woken up with no pain.  She has only done stretches since last visit.     How long can you walk comfortably?  occasional pain; worse following    Patient Stated Goals  improve ankle pain    Currently in Pain?  No/denies    Pain Score  0-No pain         [facility] PT Assessment - 11/18/18 0001      Assessment   Medical Diagnosis  Rt ankle sprain/pain    Referring Provider (PT)  Dr. Melrose Nakayama    Onset Date/Surgical Date  --   2 months   Next MD Visit  11/26/18    Prior Therapy  n/a       [facility] Adult PT Treatment/Exercise - 11/18/18 0001      Ultrasound   Ultrasound Location  Rt medial ankle at Achilles / post tib tendon    Ultrasound Parameters  50%, 1.1 w/cm2, 8 min      Ultrasound Goals  Pain      Iontophoresis   Type of Iontophoresis  Dexamethasone    Location  Lt medial ankle at Achilles tendon/ post tib tendon    Dose  1.0 cc    Time  4 hr patch, 80 mA stat patch      Ankle Exercises: Seated   BAPS  Sitting;Level 3;10 reps    CW/CCW   Other Seated Ankle Exercises  inversion/eversion on 1/2 foam roll x 15 reps, then PF/DF x 15 with RLE.       Ankle Exercises: Stretches   Soleus Stretch  30 seconds;4 reps   seated, with strap, each leg   Gastroc Stretch  2 reps;30 seconds   incline board     Ankle Exercises: Supine   T-Band  red band x 10 reps, 2 sets each (inversion, eversion, DF)      Ankle Exercises: Aerobic   Nustep  L4: 6 min for warm up          PT Long Term Goals - 10/30/18 3557  PT LONG TERM GOAL #1   Title  independent with HEP    Status  New    Target Date  12/11/18      PT LONG TERM GOAL #2   Title  demonstrate Rt ankle ROM within 1-2 degrees of Lt ankle for improved function    Status  New    Target Date  12/11/18      PT LONG TERM GOAL #3   Title  report pain < 2/10 in mornings for improved pain and function    Status  New    Target Date  12/11/18      PT LONG TERM GOAL #4   Title  FOTO score improved to </= 25% limited for improved function    Status  New    Target Date  12/11/18            Plan - 11/18/18 1015    Clinical Impression Statement  Pt had flare up over last few days but arrived pain-free.  She tolerated all same exercises of last visit without any increase in symptoms.  She is still point tender in medial Rt ankle at Achilles tendon and post tib tendon.  Pt making gradual progress towards goals.     Rehab Potential  Good    PT Frequency  2x / week    PT Duration  6 weeks    PT Treatment/Interventions  ADLs/Self Care Home Management;Cryotherapy;Ultrasound;Moist Heat;Iontophoresis 4mg /ml Dexamethasone;Electrical Stimulation;Gait training;Stair training;Functional mobility  training;Balance training;Therapeutic exercise;Therapeutic activities;Patient/family education;Manual techniques;Dry needling;Passive range of motion;Taping;Vasopneumatic Device    PT Next Visit Plan   Progress HEP as tolerated.      PT Home Exercise Plan  Access Code: [email or web address]     [email or web address] and Agree with Plan of Care  Patient       Patient will benefit from skilled therapeutic intervention in order to improve the following deficits and impairments:  Decreased range of motion, Increased muscle spasms, Increased fascial restricitons, Pain  Visit Diagnosis: Pain in right ankle and joints of right foot  Other symptoms and signs involving the musculoskeletal system     Problem List There are no active problems to display for this patient.  Kerin Perna, PTA 11/18/18 11:06 AM  [facility] [place], [place], [address] Phone: [phone]   Fax:  [phone]  Name: Michelle Pollard MRN: [id number] Date of Birth: 1946-01-11

## 2018-11-21 ENCOUNTER — Encounter: Payer: Self-pay | Admitting: Physical Therapy

## 2018-11-21 ENCOUNTER — Ambulatory Visit: Payer: Medicare Other | Admitting: Physical Therapy

## 2018-11-21 DIAGNOSIS — R29898 Other symptoms and signs involving the musculoskeletal system: Secondary | ICD-10-CM | POA: Diagnosis not present

## 2018-11-21 DIAGNOSIS — M25571 Pain in right ankle and joints of right foot: Secondary | ICD-10-CM

## 2018-11-21 NOTE — Therapy (Signed)
[place], [place], [address] Phone: [phone]   Fax:  [phone]  Physical Therapy Treatment  Patient Details  Name: Michelle Pollard MRN: [id number] Date of Birth: 02-25-1946 Referring Provider (PT): Dr. Melrose Nakayama   Encounter Date: 11/21/2018  PT End of Session - 11/21/18 1014    Visit Number  7    Number of Visits  12    PT Start Time  0933    PT Stop Time  1015    PT Time Calculation (min)  42 min    Activity Tolerance  Patient tolerated treatment well    Behavior During Therapy  [facility] for tasks assessed/performed       Past Medical History:  Diagnosis Date  . Arthritis   . Hyperlipidemia   . Pneumonia   . Rheumatic fever   . Thyroid disease     Past Surgical History:  Procedure Laterality Date  . APPENDECTOMY    . TONSILLECTOMY      There were no vitals filed for this visit.  Subjective Assessment - 11/21/18 0937    Subjective  Pt reports she had a couple episodes of twinges of pain in Achilles and side of foot, randomly while walking. Pain quickly subsided    Patient Stated Goals  improve ankle pain    Currently in Pain?  No/denies    Pain Score  0-No pain    Pain Orientation  Right;Posterior         [facility] PT Assessment - 11/21/18 0001      Assessment   Medical Diagnosis  Rt ankle sprain/pain    Referring Provider (PT)  Dr. Melrose Nakayama    Onset Date/Surgical Date  --   2 months   Next MD Visit  12/03/18    Prior Therapy  n/a      [facility] Adult PT Treatment/Exercise - 11/21/18 0001      Iontophoresis   Type of Iontophoresis  Dexamethasone    Location  Lt Achilles tendon    Dose  1.0 cc      Manual Therapy   Soft tissue mobilization  STM and cross fiber friction to Rt calf, Achilles tendon and post tib.       Ankle Exercises: Stretches   Soleus Stretch  30 seconds;2 reps   seated, with strap, each leg   Gastroc Stretch  30 seconds;3 reps   incline  board     Ankle Exercises: Supine   T-Band  red band x 10 reps, 2 sets each (inversion, eversion, DF)      Ankle Exercises: Aerobic   Nustep  L5: 6 min for warm up      Ankle Exercises: Standing   SLS  Rt SLS x 29 sec without UE assist.    Heel Raises  Both;10 reps    Toe Raise  10 reps    Other Standing Ankle Exercises  Lt step down and Rt retro step up for ROM x 10 reps (UE support on rails), 4" step, repeated on RLE.       Ankle Exercises: Seated   Ankle Circles/Pumps  AROM;Right;20 reps         PT Long Term Goals - 10/30/18 0803      PT LONG TERM GOAL #1   Title  independent with HEP    Status  New    Target Date  12/11/18      PT LONG TERM GOAL #2   Title  demonstrate  Rt ankle ROM within 1-2 degrees of Lt ankle for improved function    Status  New    Target Date  12/11/18      PT LONG TERM GOAL #3   Title  report pain < 2/10 in mornings for improved pain and function    Status  New    Target Date  12/11/18      PT LONG TERM GOAL #4   Title  FOTO score improved to </= 25% limited for improved function    Status  New    Target Date  12/11/18            Plan - 11/21/18 0942    Clinical Impression Statement  Pt had an initial twinge in Rt Achilles with heel raise, but it subsided after first rep.  She reported slight ache after a min after completing 10 reps; subsided with stretching and rest.  Her SLS time has improved from last attempt (improving by 10 seconds).  She continues to have point tenderness at mid-Achilles tendon.  Progressing well towards goals, with report of reduced pain in mornings and improved ROM.    Rehab Potential  Good    PT Frequency  2x / week    PT Duration  6 weeks    PT Treatment/Interventions  ADLs/Self Care Home Management;Cryotherapy;Ultrasound;Moist Heat;Iontophoresis 4mg /ml Dexamethasone;Electrical Stimulation;Gait training;Stair training;Functional mobility training;Balance training;Therapeutic exercise;Therapeutic  activities;Patient/family education;Manual techniques;Dry needling;Passive range of motion;Taping;Vasopneumatic Device       Patient will benefit from skilled therapeutic intervention in order to improve the following deficits and impairments:  Decreased range of motion, Increased muscle spasms, Increased fascial restricitons, Pain  Visit Diagnosis: Pain in right ankle and joints of right foot  Other symptoms and signs involving the musculoskeletal system     Problem List There are no active problems to display for this patient.  Kerin Perna, PTA 11/21/18 10:28 AM  [facility] [place], [place], [address] Phone: [phone]   Fax:  [phone]  Name: Michelle Pollard MRN: [id number] Date of Birth: [birth year]

## 2018-11-26 ENCOUNTER — Ambulatory Visit: Payer: Medicare Other | Admitting: Physical Therapy

## 2018-11-26 ENCOUNTER — Encounter: Payer: Self-pay | Admitting: Physical Therapy

## 2018-11-26 DIAGNOSIS — R29898 Other symptoms and signs involving the musculoskeletal system: Secondary | ICD-10-CM

## 2018-11-26 DIAGNOSIS — M25571 Pain in right ankle and joints of right foot: Secondary | ICD-10-CM | POA: Diagnosis not present

## 2018-11-26 NOTE — Therapy (Signed)
[place], [place], [address] Phone: [phone]   Fax:  [phone]  Physical Therapy Treatment  Patient Details  Name: Michelle Pollard MRN: [id number] Date of Birth: 1945/12/05 Referring Provider (PT): Dr. Melrose Nakayama   Encounter Date: 11/26/2018  PT End of Session - 11/26/18 1438    Visit Number  8    Number of Visits  12    Date for PT Re-Evaluation  12/10/18    PT Start Time  1435    PT Stop Time  1515    PT Time Calculation (min)  40 min    Activity Tolerance  Patient tolerated treatment well;No increased pain    Behavior During Therapy  WFL for tasks assessed/performed       Past Medical History:  Diagnosis Date  . Arthritis   . Hyperlipidemia   . Pneumonia   . Rheumatic fever   . Thyroid disease     Past Surgical History:  Procedure Laterality Date  . APPENDECTOMY    . TONSILLECTOMY      There were no vitals filed for this visit.  Subjective Assessment - 11/26/18 1438    Subjective  Pt reports she had 2 days that her ankle "just hurt" (1/10) with stiffness; took OTC medication to help decrease pain.  She feels is incapable of doing band exercises on her own correctly.  She knows the pain started when she did the NuStep at gym with heavy resistance.     Patient Stated Goals  improve ankle pain    Currently in Pain?  No/denies    Pain Score  0-No pain         [facility] PT Assessment - 11/26/18 0001      Assessment   Medical Diagnosis  Rt ankle sprain/pain    Referring Provider (PT)  Dr. Melrose Nakayama    Onset Date/Surgical Date  --   2 months   Next MD Visit  12/03/18    Prior Therapy  n/a         [facility] Adult PT Treatment/Exercise - 11/26/18 0001      Iontophoresis   Type of Iontophoresis  Dexamethasone    Location  Lt Achilles tendon    Dose  1.0 cc    Time  4 hr patch, 80 mA stat patch      Manual Therapy   Soft tissue mobilization  IASTM and cross fiber  friction to Rt calf, Achilles tendon and post tib.       Ankle Exercises: Seated   ABC's  1 rep    Towel Inversion/Eversion  Weights;5 reps   2#   Other Seated Ankle Exercises  sit to/from stand @ low black mat x 10 reps      Ankle Exercises: Standing   SLS  Rt/Lt SLS on blue pad, multiple trials, up to 3 sec (very challenging, occasional "twinge in Rt ankle".     Heel Raises  Both;10 reps   heels off step     Ankle Exercises: Stretches   Soleus Stretch  2 reps;30 seconds   standing, to tolerance   Gastroc Stretch  30 seconds;3 reps   incline board     Ankle Exercises: Aerobic   Nustep  L5: 6 min for warm up                  PT Long Term Goals - 11/26/18 1445      PT LONG TERM GOAL #1  Title  independent with HEP    Status  On-going      PT LONG TERM GOAL #2   Title  demonstrate Rt ankle ROM within 1-2 degrees of Lt ankle for improved function    Status  Achieved      PT LONG TERM GOAL #3   Title  report pain < 2/10 in mornings for improved pain and function    Baseline  --   improving   Status  Partially Met      PT LONG TERM GOAL #4   Title  FOTO score improved to </= 25% limited for improved function    Status  On-going            Plan - 11/26/18 1608    Clinical Impression Statement  Pt tolerated all exercises well, without any increase in pain/tenderness.  Pt reported greater ease at performing weighted towel crunch vs ankle band exercises; will sub this exericse in HEP.  Pt has persistent tightness and tenderness along medial side of Rt Achilles tendon; repeated STM and ionto today.  Progressing towards remaining goals.     Rehab Potential  Good    PT Frequency  2x / week    PT Duration  6 weeks    PT Treatment/Interventions  ADLs/Self Care Home Management;Cryotherapy;Ultrasound;Moist Heat;Iontophoresis 63m/ml Dexamethasone;Electrical Stimulation;Gait training;Stair training;Functional mobility training;Balance training;Therapeutic  exercise;Therapeutic activities;Patient/family education;Manual techniques;Dry needling;Passive range of motion;Taping;Vasopneumatic Device    PT Next Visit Plan  MD note.     Consulted and Agree with Plan of Care  Patient       Patient will benefit from skilled therapeutic intervention in order to improve the following deficits and impairments:  Decreased range of motion, Increased muscle spasms, Increased fascial restricitons, Pain  Visit Diagnosis: Pain in right ankle and joints of right foot  Other symptoms and signs involving the musculoskeletal system     Problem List There are no active problems to display for this patient.  JKerin Perna PTA 11/26/18 5:05 PM  [place] [address]Phone: [phone]  Fax:  [phone] Name: VSatin BoalMRN: [id number] of Birth: 1Oct 17, 1947

## 2018-11-28 ENCOUNTER — Ambulatory Visit: Payer: Medicare Other | Admitting: Physical Therapy

## 2018-11-28 ENCOUNTER — Encounter: Payer: Self-pay | Admitting: Physical Therapy

## 2018-11-28 DIAGNOSIS — R29898 Other symptoms and signs involving the musculoskeletal system: Secondary | ICD-10-CM | POA: Diagnosis not present

## 2018-11-28 DIAGNOSIS — M25571 Pain in right ankle and joints of right foot: Secondary | ICD-10-CM | POA: Diagnosis not present

## 2018-11-28 NOTE — Therapy (Addendum)
[place], [place], [address] Phone: [phone]   Fax:  [phone]  Physical Therapy Treatment/Discharge  Patient Details  Name: Michelle Pollard MRN: [id number] Date of Birth: 1946-03-09 Referring Provider (PT): Dr. Melrose Nakayama   Encounter Date: 11/28/2018  PT End of Session - 11/28/18 1035    Visit Number  9    Number of Visits  12    Date for PT Re-Evaluation  12/10/18    PT Start Time  1013    PT Stop Time  1055    PT Time Calculation (min)  42 min    Activity Tolerance  Patient tolerated treatment well;No increased pain    Behavior During Therapy  WFL for tasks assessed/performed       Past Medical History:  Diagnosis Date  . Arthritis   . Hyperlipidemia   . Pneumonia   . Rheumatic fever   . Thyroid disease     Past Surgical History:  Procedure Laterality Date  . APPENDECTOMY    . TONSILLECTOMY      There were no vitals filed for this visit.  Subjective Assessment - 11/28/18 1018    Subjective  Pt reports she was stiff in her Achilles upon waking, but she does ankle circles and this resolves.  She also believes she may be overdoing it at gym; she is modifying her routine to see if this helps reduce discomfort and stiffness.     Patient Stated Goals  improve ankle pain    Currently in Pain?  No/denies    Pain Score  0-No pain         [facility] PT Assessment - 11/28/18 0001      Assessment   Medical Diagnosis  Rt ankle sprain/pain    Referring Provider (PT)  Dr. Melrose Nakayama    Onset Date/Surgical Date  --   2 months   Next MD Visit  12/03/18    Prior Therapy  n/a      Observation/Other Assessments   Focus on Therapeutic Outcomes (FOTO)   74 (26% limited), - goal of 24% limited      AROM   AROM Assessment Site  Ankle    Right Ankle Dorsiflexion  14    Right Ankle Plantar Flexion  59    Right Ankle Inversion  43    Right Ankle Eversion  20    Left Ankle  Dorsiflexion  9    Left Ankle Plantar Flexion  58    Left Ankle Inversion  40    Left Ankle Eversion  18       [facility] Adult PT Treatment/Exercise - 11/28/18 0001      Self-Care   Other Self-Care Comments   Pt educated on self massage to Rt Achilles tendon to decrease pain and stiffness; pt returned demo with cues.       Iontophoresis   Type of Iontophoresis  Dexamethasone    Location  Lt Achilles tendon    Dose  1.0 cc    Time  4 hr patch, 80 mA stat patch      Ankle Exercises: Stretches   Soleus Stretch  2 reps;30 seconds   standing, to tolerance   Gastroc Stretch  30 seconds;3 reps   incline board     Ankle Exercises: Aerobic   Nustep  L5: 5 min for warm up      Ankle Exercises: Seated   Towel Inversion/Eversion  Weights;3 reps   3#  Ankle Exercises: Standing   SLS  Rt/ Lt SLS on flat surface up to 22 seconds bilat.     Heel Raises  Both;20 reps   heels off step   Toe Raise  20 reps    Other Standing Ankle Exercises   forward step down and retro step up for ROM x 10 reps each side.         PT Long Term Goals - 11/28/18 1059      PT LONG TERM GOAL #1   Title  independent with HEP    Status  On-going      PT LONG TERM GOAL #2   Title  demonstrate Rt ankle ROM within 1-2 degrees of Lt ankle for improved function    Status  Achieved      PT LONG TERM GOAL #3   Title  report pain < 2/10 in mornings for improved pain and function    Status  Achieved      PT LONG TERM GOAL #4   Title  FOTO score improved to </= 25% limited for improved function    Status  On-going            Plan - 11/28/18 1059    Clinical Impression Statement  Pt tolerated all exercises with minimal increase in symptoms in Rt Achilles.  Rt ankle ROM significantly improved from initial eval.  Pt has partially met her goals and is on track to meet remaining goals.     Rehab Potential  Good    PT Frequency  2x / week    PT Duration  6 weeks    PT Treatment/Interventions  ADLs/Self  Care Home Management;Cryotherapy;Ultrasound;Moist Heat;Iontophoresis '4mg'$ /ml Dexamethasone;Electrical Stimulation;Gait training;Stair training;Functional mobility training;Balance training;Therapeutic exercise;Therapeutic activities;Patient/family education;Manual techniques;Dry needling;Passive range of motion;Taping;Vasopneumatic Device    PT Next Visit Plan  continue progressive ankle ROM and strengthening. 10th visit note.    PT Home Exercise Plan  Access Code: [email or web address]     [email or web address] and Agree with Plan of Care  Patient       Patient will benefit from skilled therapeutic intervention in order to improve the following deficits and impairments:  Decreased range of motion, Increased muscle spasms, Increased fascial restricitons, Pain  Visit Diagnosis: Pain in right ankle and joints of right foot  Other symptoms and signs involving the musculoskeletal system     Problem List There are no active problems to display for this patient.  Kerin Perna, PTA 11/28/18 11:03 AM  [place], [place], [address] Phone: [phone]   Fax:  [phone]  Name: Michelle Pollard MRN: [id number] Date of Birth: [birth year]     PHYSICAL THERAPY DISCHARGE SUMMARY  Visits from Start of Care: 9  Current functional level related to goals / functional outcomes: See above   Remaining deficits: See above   Education / Equipment: HEP  Plan: Patient agrees to discharge.  Patient goals were partially met. Patient is being discharged due to being pleased with the current functional level.  ?????     Laureen Abrahams, PT, DPT 12/11/18 4:02 PM  [facility] [place], [place] [address]  [phone] (office) [phone] (fax)

## 2018-12-03 ENCOUNTER — Encounter: Payer: Medicare Other | Admitting: Physical Therapy

## 2018-12-05 ENCOUNTER — Encounter: Payer: Medicare Other | Admitting: Physical Therapy

## 2018-12-07 IMAGING — US US PELVIS COMPLETE TRANSABD/TRANSVAG
1 series · 14 of 25 positions shown · non-contrast
Comparison: None

CLINICAL DATA: Calcified pelvic mass seen on x-ray.

EXAM:
TRANSABDOMINAL AND TRANSVAGINAL ULTRASOUND OF PELVIS
TECHNIQUE: Both transabdominal and transvaginal ultrasound examinations of the
pelvis were performed. Transabdominal technique was performed for
global imaging of the pelvis including uterus, ovaries, adnexal
regions, and pelvic cul-de-sac. It was necessary to proceed with
endovaginal exam following the transabdominal exam to visualize the
endometrium and ovaries.

[Series 1: us pelvis complete transabd/transvag · 0.21mm/px · 14 of 56 slices shown]
[im 1/56]
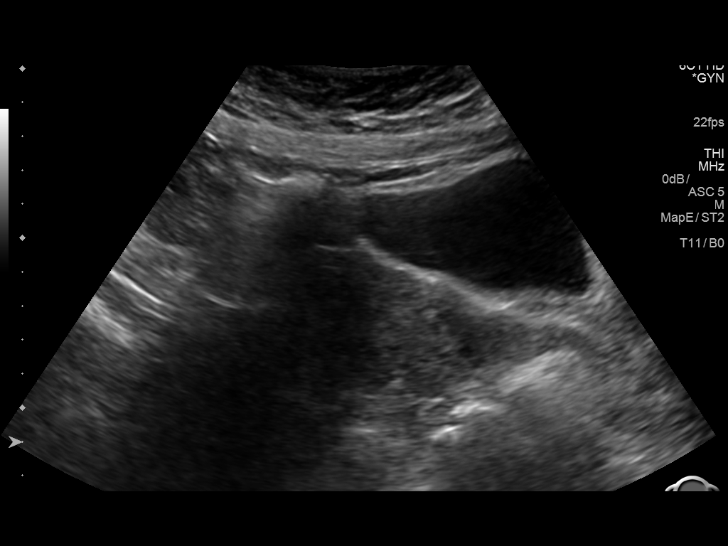
[im 5/56]
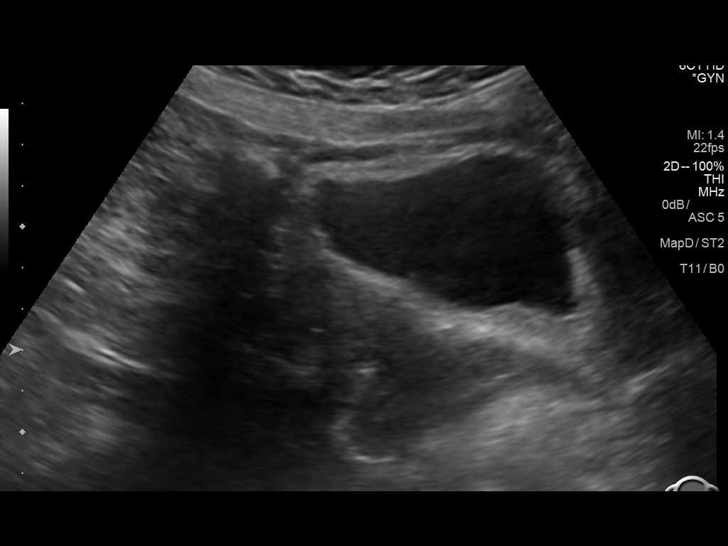
[im 10/56]
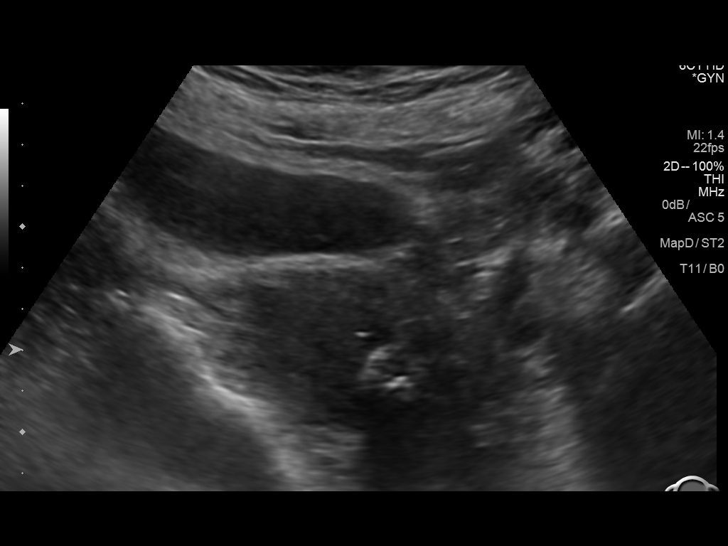
[im 14/56]
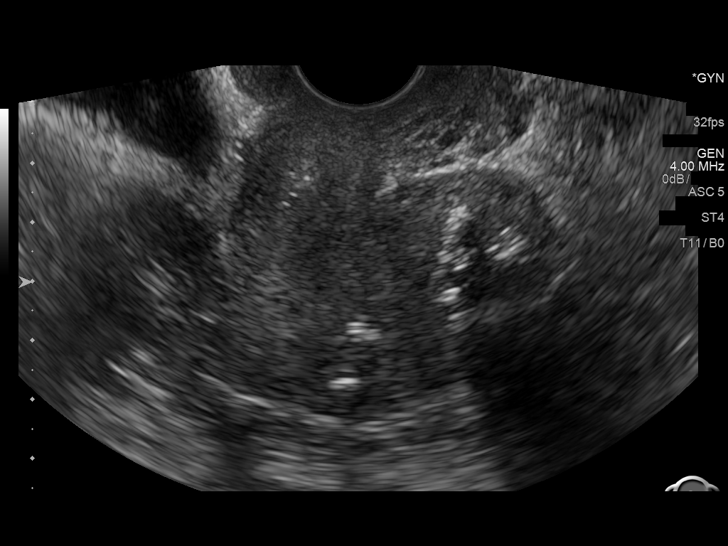
[im 19/56]
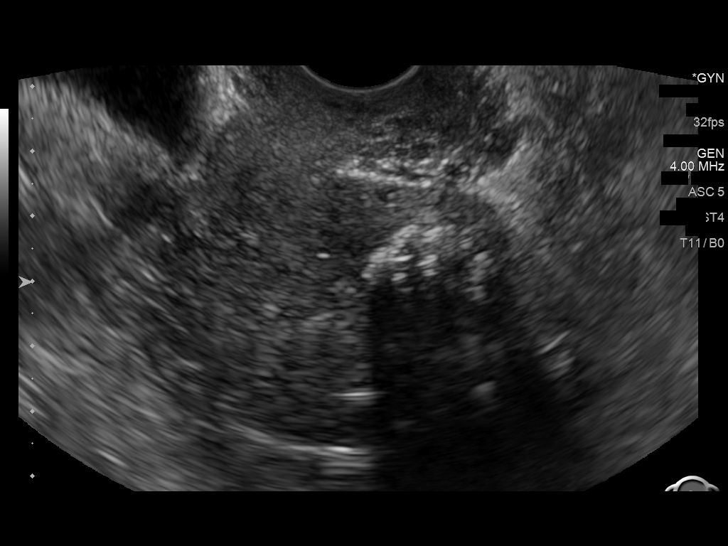
[im 21/56]
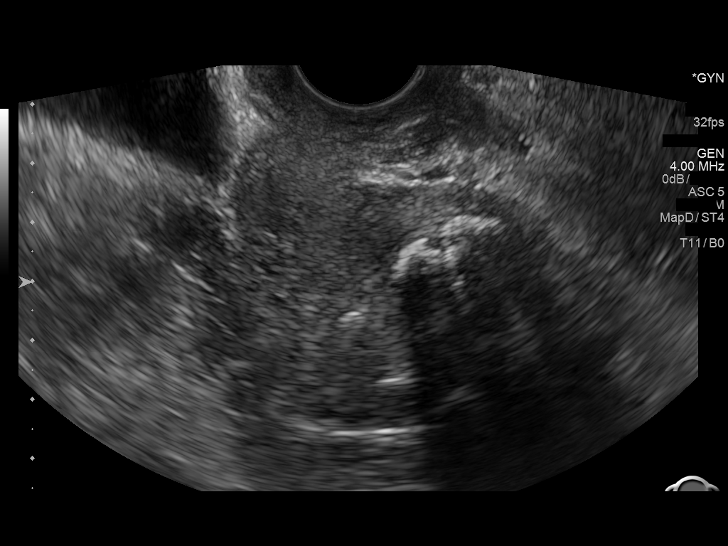
[im 26/56]
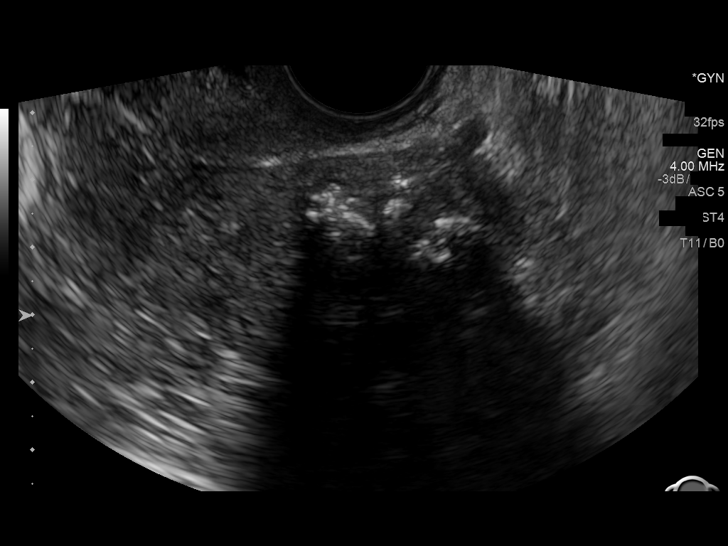
[im 30/56]
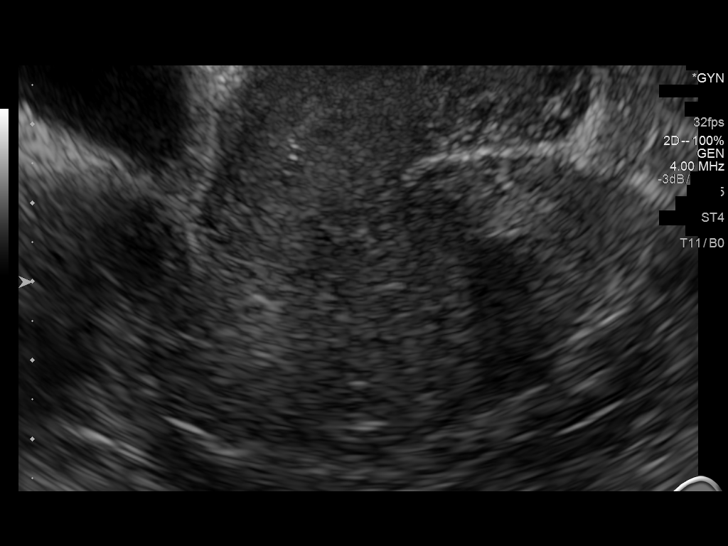
[im 35/56]
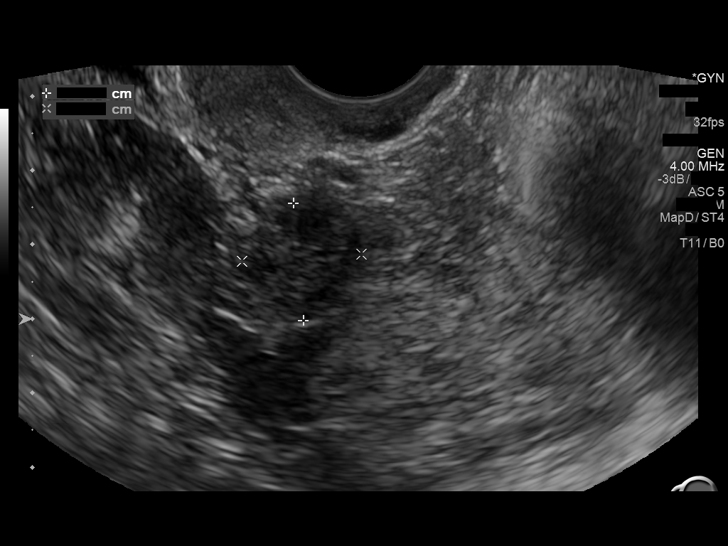
[im 37/56]
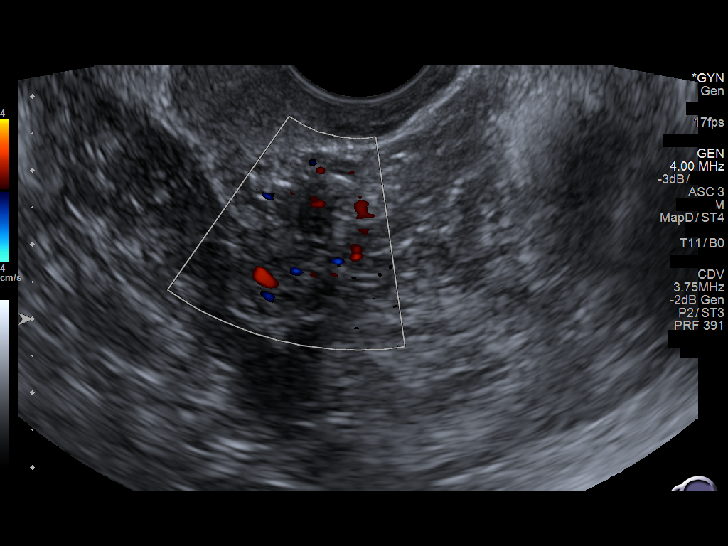
[im 42/56]
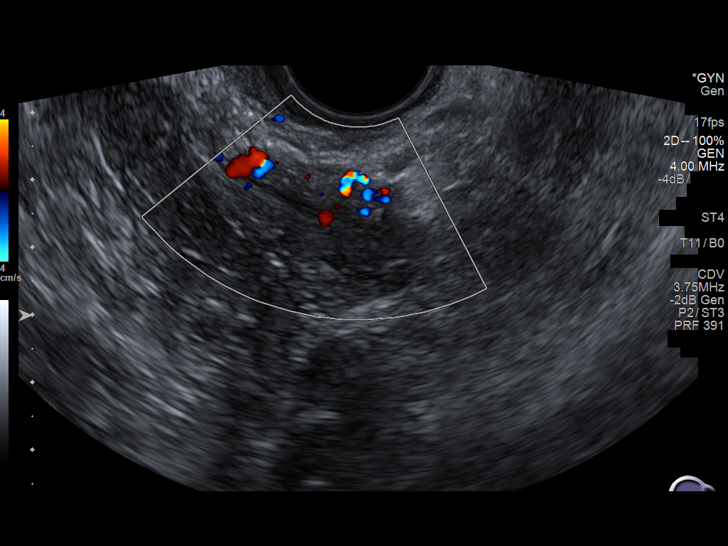
[im 46/56]
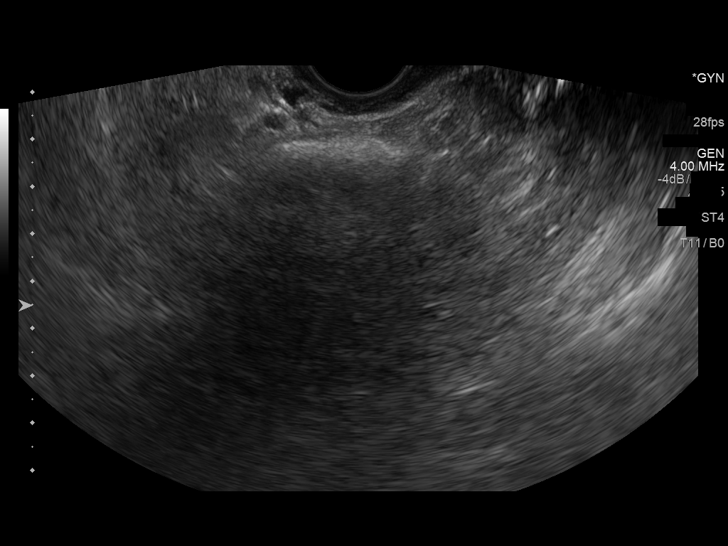
[im 51/56]
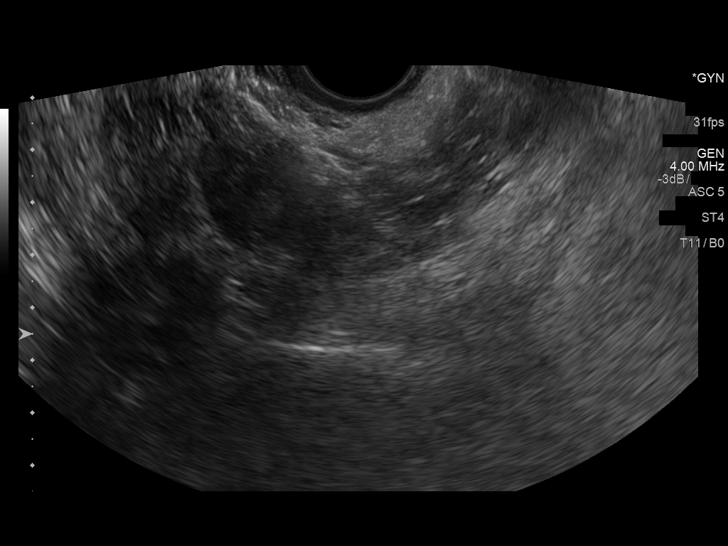
[im 56/56]
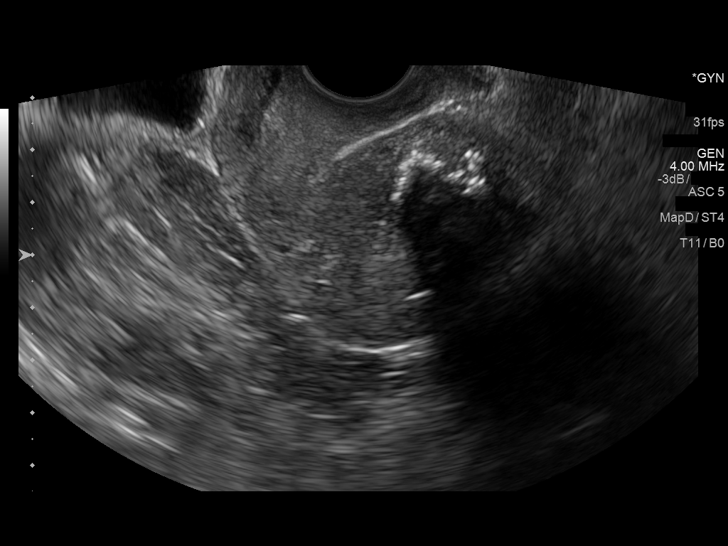

[14 of 25 positions shown; findings below may reference images not displayed]

FINDINGS: Uterus

Measurements: 5.5 x 3.1 x 5.8 cm. Retroflexed. A calcified
intramural fibroid is seen in posterior corpus which measures 3.0 cm
maximum diameter.

Endometrium

Thickness: 3 mm.  No focal abnormality visualized.

Right ovary

Measurements: 1.6 x 1.6 x 1 6 cm. Normal appearance/no adnexal mass.

Left ovary

Measurements: 2.4 x 1.5 x 1.9 cm. Normal appearance/no adnexal mass.

Other findings

No abnormal free fluid.
IMPRESSION: 3 cm calcified fibroid in posterior uterine corpus.

Normal appearance of both ovaries.  No adnexal mass identified.

## 2019-07-06 ENCOUNTER — Ambulatory Visit (INDEPENDENT_AMBULATORY_CARE_PROVIDER_SITE_OTHER): Payer: Medicare Other | Admitting: Obstetrics & Gynecology

## 2019-07-06 ENCOUNTER — Other Ambulatory Visit: Payer: Self-pay

## 2019-07-06 ENCOUNTER — Encounter: Payer: Self-pay | Admitting: Obstetrics & Gynecology

## 2019-07-06 VITALS — BP 140/81 | HR 78 | Resp 16 | Ht 65.0 in | Wt 150.0 lb

## 2019-07-06 DIAGNOSIS — Z01419 Encounter for gynecological examination (general) (routine) without abnormal findings: Secondary | ICD-10-CM | POA: Diagnosis not present

## 2019-07-06 DIAGNOSIS — Z87440 Personal history of urinary (tract) infections: Secondary | ICD-10-CM | POA: Diagnosis not present

## 2019-07-06 NOTE — Progress Notes (Signed)
Subjective:    Michelle Pollard is a 73 y.o. single P2  female who presents for an annual exam. She was treated for a UTI at [facility] 06/25/19. She has a pessary (is now using her #3). She saw blood on her pessary after this but no more blood seen since then. The patient is not currently sexually active. GYN screening history: last pap: was normal. The patient wears seatbelts: yes. The patient participates in regular exercise: yes. Has the patient ever been transfused or tattooed?: no. The patient reports that there is not domestic violence in her life.   Menstrual History: OB History    Gravida  3   Para  2   Term  2   Preterm      AB  1   Living  2     SAB      TAB  1   Ectopic      Multiple      Live Births              Menarche age:  No LMP recorded. Patient is postmenopausal.    The following portions of the patient's history were reviewed and updated as appropriate: allergies, current medications, past family history, past medical history, past social history, past surgical history and problem list.  Review of Systems Pertinent items are noted in HPI.   She lives by herself. FH- + breast cancer in her postmenopausal mom, no gyn or colon cancer S/p colonoscopy 4 years ago. She declines a flu vaccine at this time, her Fam med doc will do this. Mammogram 7/20  Objective:    BP 140/81   Pulse 78   Resp 16   Ht 5\' 5"  (1.651 m)   Wt 150 lb (68 kg)   BMI 24.96 kg/m   General Appearance:    Alert, cooperative, no distress, appears stated age  Head:    Normocephalic, without obvious abnormality, atraumatic  Eyes:    PERRL, conjunctiva/corneas clear, EOM's intact, fundi    benign, both eyes  Ears:    Normal TM's and external ear canals, both ears  Nose:   Nares normal, septum midline, mucosa normal, no drainage    or sinus tenderness  Throat:   Lips, mucosa, and tongue normal; teeth and gums normal  Neck:   Supple, symmetrical, trachea midline, no adenopathy;     thyroid:  no enlargement/tenderness/nodules; no carotid   bruit or JVD  Back:     Symmetric, no curvature, ROM normal, no CVA tenderness  Lungs:     Clear to auscultation bilaterally, respirations unlabored  Chest Wall:    No tenderness or deformity   Heart:    Regular rate and rhythm, S1 and S2 normal, no murmur, rub   or gallop  Breast Exam:    No tenderness, masses, or nipple abnormality  Abdomen:     Soft, non-tender, bowel sounds active all four quadrants,    no masses, no organomegaly  Genitalia:    Normal female without lesion, discharge or tenderness, thorough speculum exam reveals no excoriations/raw areas. normal size and shape, anteverted, mobile, non-tender, normal adnexal exam 2nd degree uterine prolapse     Extremities:   Extremities normal, atraumatic, no cyanosis or edema  Pulses:   2+ and symmetric all extremities  Skin:   Skin color, texture, turgor normal, no rashes or lesions  Lymph nodes:   Cervical, supraclavicular, and axillary nodes normal  Neurologic:   CNII-XII intact, normal strength, sensation and reflexes  throughout  .    Assessment:    Healthy female exam.   Uterine prolapse   Plan:   continue pessary use prn

## 2019-07-07 LAB — URINALYSIS
Bilirubin Urine: NEGATIVE
Glucose, UA: NEGATIVE
Hgb urine dipstick: NEGATIVE
Ketones, ur: NEGATIVE
Nitrite: NEGATIVE
Specific Gravity, Urine: 1.023 (ref 1.001–1.03)
pH: 5.5 (ref 5.0–8.0)

## 2019-07-23 ENCOUNTER — Ambulatory Visit: Payer: Medicare Other | Admitting: Obstetrics & Gynecology
